# Patient Record
Sex: Female | Born: 1950 | Race: White | Hispanic: No | Marital: Married | Smoking: Never smoker
Health system: Southern US, Community
[De-identification: ages and names within clinical notes are randomized; demographics above are authoritative.]

## PROBLEM LIST (undated history)

## (undated) DIAGNOSIS — C50919 Malignant neoplasm of unspecified site of unspecified female breast: Secondary | ICD-10-CM

---

## 1980-10-21 DIAGNOSIS — C50919 Malignant neoplasm of unspecified site of unspecified female breast: Secondary | ICD-10-CM

## 1980-10-21 HISTORY — PX: MASTECTOMY: SHX3

## 1980-10-21 HISTORY — DX: Malignant neoplasm of unspecified site of unspecified female breast: C50.919

## 2004-10-10 ENCOUNTER — Ambulatory Visit: Payer: Self-pay | Admitting: Internal Medicine

## 2005-10-08 ENCOUNTER — Ambulatory Visit: Payer: Self-pay | Admitting: Internal Medicine

## 2006-01-09 ENCOUNTER — Ambulatory Visit: Payer: Self-pay | Admitting: Gastroenterology

## 2006-05-29 ENCOUNTER — Ambulatory Visit: Payer: Self-pay | Admitting: Gastroenterology

## 2006-11-10 ENCOUNTER — Ambulatory Visit: Payer: Self-pay | Admitting: Internal Medicine

## 2006-11-14 ENCOUNTER — Ambulatory Visit: Payer: Self-pay | Admitting: Internal Medicine

## 2006-11-21 HISTORY — PX: BREAST BIOPSY: SHX20

## 2006-12-09 ENCOUNTER — Ambulatory Visit: Payer: Self-pay | Admitting: Internal Medicine

## 2006-12-09 ENCOUNTER — Ambulatory Visit: Payer: Self-pay | Admitting: Surgery

## 2006-12-20 ENCOUNTER — Ambulatory Visit: Payer: Self-pay | Admitting: Internal Medicine

## 2007-02-09 ENCOUNTER — Ambulatory Visit: Payer: Self-pay | Admitting: Internal Medicine

## 2007-02-19 ENCOUNTER — Ambulatory Visit: Payer: Self-pay | Admitting: Internal Medicine

## 2007-03-25 ENCOUNTER — Ambulatory Visit: Payer: Self-pay | Admitting: Surgery

## 2008-02-18 ENCOUNTER — Ambulatory Visit: Payer: Self-pay | Admitting: Internal Medicine

## 2008-04-06 ENCOUNTER — Ambulatory Visit (HOSPITAL_COMMUNITY): Admission: RE | Admit: 2008-04-06 | Discharge: 2008-04-07 | Payer: Self-pay | Admitting: Neurosurgery

## 2008-05-05 ENCOUNTER — Encounter: Admission: RE | Admit: 2008-05-05 | Discharge: 2008-05-05 | Payer: Self-pay | Admitting: Neurosurgery

## 2008-10-11 ENCOUNTER — Ambulatory Visit: Payer: Self-pay | Admitting: Internal Medicine

## 2009-11-30 ENCOUNTER — Ambulatory Visit: Payer: Self-pay | Admitting: Internal Medicine

## 2010-02-28 ENCOUNTER — Ambulatory Visit: Payer: Self-pay | Admitting: Internal Medicine

## 2010-12-03 ENCOUNTER — Ambulatory Visit: Payer: Self-pay | Admitting: Internal Medicine

## 2011-03-05 NOTE — Op Note (Signed)
[name], [name]                    ACCOUNT NO.:  [id number]   MEDICAL RECORD NO.:  [id number]          PATIENT TYPE:  OIB   LOCATION:  3526                         FACILITY:  [facility]   PHYSICIAN:  Donalee Citrin, M.D.        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   PROCEDURE:  Anterior cervical diskectomy and fusion at C3-C4 and C4-C5  using 7-mm allograft wedges and a 42-mm venture plate with six 31-DV  variable screws.   SURGEON:  Donalee Citrin, MD   ASSISTANT:  Dr. Yetta Barre.   ANESTHESIA:  General endotracheal.   HISTORY OF PRESENT ILLNESS:  The patient is a very pleasant 60 year old  female who has had progressive worsening neck and bilateral shoulder  pain, refractory, pain radiating into deltoid down to her elbow.  Imaging showed severe cervical spondylosis with stenosis at C3-C4, C4-  C5.  At C4-C5, she had marked collapse with degenerative spondylosis and  spur compressing both C5 neuroforamen and central cord as well as motor  change in the endplates, C3-C4 as predominantly central stenosis from  soft disk material.  Due to the patient's very conservative treatment  and MRI findings and physical exam, the patient was recommended a 3-  level anterior cervical diskectomy fusion.  Risks and benefits of the  operation were explained to the patient and she understood and agreed to  proceed forth.   PROCEDURE IN DETAIL:  The patient was brought to the OR.  He was induced  general anesthesia, positioned supine, neck in slightly extension, and 5  pounds of Holter traction.  The right side of his neck was prepped and  draped in the usual sterile fashion.  Preop incision was localized at  the appropriate level.  A curvilinear incision was made just off the  midline to the anterior portion of the  sternocleidomastoid.  The  superficial layer of the platysma was dissected out and divided  longitudinally.  The avascular plane between the mastoid and strap  muscle was developed down to the prevertebral fascia.  The prevertebral  fascia was dissected with Kitners.  Intraoperative fluoroscopy confirmed  localization of the appropriate level at C5-C6 and annulotomy with a 15-  mm scalpel to mark the disk spaces and then the longus colli was  reflected laterally and self-retaining retractors were placed.  Large  anterior osteophytes were bitten up to C4-C5 disk space with calcified  disk anteriorly.  Then using a __________  C3-C4 vertebral bodies  respectively, the annulotomies were extended and high-speed drill was  used to drill down on both disk space on the posterior annulus and  posterior osteophyte complexes.  At this point, the operative microscope  was draped and brought into the field of operation first at C4-C5.  There was a marked collapse and degeneration at the disk space with  large osteophyte coming off the C4 vertebral body displacing  the central  canal and extending predominantly rightwards.  This was drilled down  under with decompressing the central canal then aggressive under biting  of both the C4-C5 endplates were carried out.  The PLL removed in  piecemeal fashion decompressing the thecal sac at this level marching  across both C5 pedicles and both C5 nerve roots were skeletonized,  flushed with a pedicle until a significant portion of the proximal C4  nerve roots were visualized on both sides and decompressed easily  accepting a nerve hook to confirm decompression.  Then endplates were  scraped.  Gelfoam was placed, tension taken off C3-C4 is in similar  fashion.  C3-C4 was drilled down.  Predominantly, C3-C4 was stenotic  from soft disk material.  This was all under the vertebral body  displacement of rightward aspect of the thecal sac.  This was  aggressively  under bitten decompression central canal both C4-C5  pedicles were identified.  PLL was removed in piecemeal fashion.  Both  C4 nerve roots were skeletonized, flushed with a pedicle, and after  adequate under biting of both endplates was achieved and central canal  was decompressed, the endplates were then scraped and a size 7-mm  allograft was __________  at C3-C4.  This was repeated at C4-C5.  Then,  a 42-mm venture plate was placed.  All screws had excellent purchase.  Locking mechanism was engaged.  Postop fluoroscopy confirmed good  position of the plates, screws, and bone grafts.  Then meticulous  hemostasis was maintained.  The wound was copiously irrigated and closed  with interrupted Vicryl closed with running 4-0 subcuticular.  Benzoin  and Steri-Strips were applied.  Sterile dressing were applied.  The  patient went to the recovery room in stable condition.  At the end of  the case, initial sponge, needle, and instrument counts were correct.           ______________________________  Donalee Citrin, M.D.     GC/MEDQ  D:  04/06/2008  T:  04/07/2008  Job:  [id number]

## 2011-07-02 ENCOUNTER — Ambulatory Visit: Payer: Self-pay | Admitting: Internal Medicine

## 2011-07-18 LAB — CBC
HCT: 36.5
MCV: 90.8
Platelets: 358
WBC: 7.6

## 2011-07-18 LAB — BASIC METABOLIC PANEL
BUN: 18
Chloride: 107
Glucose, Bld: 110 — ABNORMAL HIGH
Potassium: 4.4

## 2011-12-19 ENCOUNTER — Ambulatory Visit: Payer: Self-pay | Admitting: Internal Medicine

## 2012-03-09 ENCOUNTER — Ambulatory Visit: Payer: Self-pay | Admitting: Oncology

## 2012-03-09 ENCOUNTER — Ambulatory Visit: Payer: Self-pay

## 2013-04-08 ENCOUNTER — Ambulatory Visit: Payer: Self-pay | Admitting: Internal Medicine

## 2014-03-22 DIAGNOSIS — E039 Hypothyroidism, unspecified: Secondary | ICD-10-CM | POA: Insufficient documentation

## 2014-03-22 DIAGNOSIS — I10 Essential (primary) hypertension: Secondary | ICD-10-CM | POA: Insufficient documentation

## 2014-04-11 ENCOUNTER — Ambulatory Visit: Payer: Self-pay | Admitting: Internal Medicine

## 2015-03-24 ENCOUNTER — Other Ambulatory Visit: Payer: Self-pay | Admitting: Internal Medicine

## 2015-03-24 DIAGNOSIS — Z1231 Encounter for screening mammogram for malignant neoplasm of breast: Secondary | ICD-10-CM

## 2015-04-13 ENCOUNTER — Other Ambulatory Visit: Payer: Self-pay | Admitting: Internal Medicine

## 2015-04-13 ENCOUNTER — Ambulatory Visit
Admission: RE | Admit: 2015-04-13 | Discharge: 2015-04-13 | Disposition: A | Discharge status: Home / Self Care | Payer: No Typology Code available for payment source | Source: Ambulatory Visit | Attending: Internal Medicine | Admitting: Internal Medicine

## 2015-04-13 DIAGNOSIS — Z1231 Encounter for screening mammogram for malignant neoplasm of breast: Secondary | ICD-10-CM | POA: Insufficient documentation

## 2015-04-13 HISTORY — DX: Malignant neoplasm of unspecified site of unspecified female breast: C50.919

## 2015-06-19 ENCOUNTER — Other Ambulatory Visit: Payer: Self-pay | Admitting: Nurse Practitioner

## 2015-06-19 DIAGNOSIS — R1310 Dysphagia, unspecified: Secondary | ICD-10-CM

## 2015-06-19 DIAGNOSIS — K219 Gastro-esophageal reflux disease without esophagitis: Secondary | ICD-10-CM | POA: Insufficient documentation

## 2015-06-22 ENCOUNTER — Ambulatory Visit
Admission: RE | Admit: 2015-06-22 | Discharge: 2015-06-22 | Disposition: A | Discharge status: Home / Self Care | Payer: PRIVATE HEALTH INSURANCE | Source: Ambulatory Visit | Attending: Nurse Practitioner | Admitting: Nurse Practitioner

## 2015-06-22 DIAGNOSIS — K228 Other specified diseases of esophagus: Secondary | ICD-10-CM | POA: Insufficient documentation

## 2015-06-22 DIAGNOSIS — R1310 Dysphagia, unspecified: Secondary | ICD-10-CM | POA: Insufficient documentation

## 2016-03-25 ENCOUNTER — Other Ambulatory Visit: Payer: Self-pay | Admitting: Internal Medicine

## 2016-03-25 DIAGNOSIS — Z1231 Encounter for screening mammogram for malignant neoplasm of breast: Secondary | ICD-10-CM

## 2016-04-16 ENCOUNTER — Ambulatory Visit
Admission: RE | Admit: 2016-04-16 | Discharge: 2016-04-16 | Disposition: A | Discharge status: Home / Self Care | Payer: Managed Care, Other (non HMO) | Source: Ambulatory Visit | Attending: Internal Medicine | Admitting: Internal Medicine

## 2016-04-16 ENCOUNTER — Other Ambulatory Visit: Payer: Self-pay | Admitting: Internal Medicine

## 2016-04-16 DIAGNOSIS — Z1231 Encounter for screening mammogram for malignant neoplasm of breast: Secondary | ICD-10-CM

## 2016-10-18 ENCOUNTER — Ambulatory Visit: Payer: Self-pay | Admitting: Physician Assistant

## 2016-10-18 ENCOUNTER — Encounter: Payer: Self-pay | Admitting: Physician Assistant

## 2016-10-18 VITALS — BP 100/62 | HR 83 | Temp 98.6°F

## 2016-10-18 DIAGNOSIS — R0981 Nasal congestion: Secondary | ICD-10-CM

## 2016-10-18 NOTE — Progress Notes (Signed)
   Subjective:Sinus congestion    Patient ID: [name], female    DOB: December 01, 1950, 65 y.o.   MRN: [id number]  HPI Patient complian of sinus congestion onset yesterday. 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 States cannot tolerate Sudafed products 2nd to upset stomach. Denies fever. States facial and ear pressure. Denies N/V/D.   Review of Systems Hyperthyroidism    Objective:   Physical Exam No acute distress. HEENT for bilateral maxillary guarding. Edematous right TM. Edematous nasal turbinates. Clear Rhinorrhea. Neck supple. Lungs CTA and Heart RRR.       Assessment & Plan:Sinus  congestion.   Continue Allerga and start Phenylephrine as directed.  Follow up with Family Doctor if no improvement.

## 2017-03-27 ENCOUNTER — Other Ambulatory Visit: Payer: Self-pay | Admitting: Internal Medicine

## 2017-03-27 DIAGNOSIS — Z1231 Encounter for screening mammogram for malignant neoplasm of breast: Secondary | ICD-10-CM

## 2017-04-17 ENCOUNTER — Ambulatory Visit
Admission: RE | Admit: 2017-04-17 | Discharge: 2017-04-17 | Disposition: A | Discharge status: Home / Self Care | Payer: Managed Care, Other (non HMO) | Source: Ambulatory Visit | Attending: Internal Medicine | Admitting: Internal Medicine

## 2017-04-17 DIAGNOSIS — Z1231 Encounter for screening mammogram for malignant neoplasm of breast: Secondary | ICD-10-CM | POA: Insufficient documentation

## 2017-08-14 ENCOUNTER — Ambulatory Visit: Payer: Self-pay | Admitting: Physician Assistant

## 2017-08-14 VITALS — BP 140/70 | HR 93 | Temp 98.7°F | Resp 16

## 2017-08-14 DIAGNOSIS — J069 Acute upper respiratory infection, unspecified: Secondary | ICD-10-CM

## 2017-08-14 MED ORDER — METHYLPREDNISOLONE 4 MG PO TBPK: ORAL_TABLET | ORAL | 0 refills | Status: AC

## 2017-08-14 MED ORDER — AZITHROMYCIN 250 MG PO TABS: ORAL_TABLET | ORAL | 0 refills | Status: AC

## 2017-08-14 NOTE — Progress Notes (Signed)
S: C/o cough and congestion for 9 days, no fever, chills, cp/sob, v/d; mucus was green this am but clear throughout the day, cough is sporadic, will start coughing and can't stop   Using otc meds: robitussin  O: PE: vitals wnl, nad, perrl eomi, normocephalic, tms dull, nasal mucosa red and swollen, throat injected, neck supple no lymph, lungs c t a, cv rrr, neuro intact  A:  Acute uri   P: drink fluids, continue regular meds , use otc meds of choice, return if not improving in 5 days, return earlier if worsening , zpack, medrol dose pack, tussionex 137ml

## 2018-03-09 ENCOUNTER — Other Ambulatory Visit: Payer: Self-pay | Admitting: Internal Medicine

## 2018-03-09 DIAGNOSIS — Z1231 Encounter for screening mammogram for malignant neoplasm of breast: Secondary | ICD-10-CM

## 2018-04-21 ENCOUNTER — Ambulatory Visit
Admission: RE | Admit: 2018-04-21 | Discharge: 2018-04-21 | Disposition: A | Discharge status: Home / Self Care | Payer: Medicare HMO | Source: Ambulatory Visit | Attending: Internal Medicine | Admitting: Internal Medicine

## 2018-04-21 DIAGNOSIS — Z1231 Encounter for screening mammogram for malignant neoplasm of breast: Secondary | ICD-10-CM | POA: Diagnosis not present

## 2018-04-28 ENCOUNTER — Other Ambulatory Visit: Payer: Self-pay | Admitting: Internal Medicine

## 2018-04-28 DIAGNOSIS — R928 Other abnormal and inconclusive findings on diagnostic imaging of breast: Secondary | ICD-10-CM

## 2018-04-28 DIAGNOSIS — N6310 Unspecified lump in the right breast, unspecified quadrant: Secondary | ICD-10-CM

## 2018-04-29 ENCOUNTER — Ambulatory Visit
Admission: RE | Admit: 2018-04-29 | Discharge: 2018-04-29 | Disposition: A | Discharge status: Home / Self Care | Payer: Medicare HMO | Source: Ambulatory Visit | Attending: Internal Medicine | Admitting: Internal Medicine

## 2018-04-29 ENCOUNTER — Other Ambulatory Visit: Payer: Self-pay | Admitting: Internal Medicine

## 2018-04-29 DIAGNOSIS — N6310 Unspecified lump in the right breast, unspecified quadrant: Secondary | ICD-10-CM

## 2018-04-29 DIAGNOSIS — R928 Other abnormal and inconclusive findings on diagnostic imaging of breast: Secondary | ICD-10-CM

## 2018-04-30 ENCOUNTER — Other Ambulatory Visit: Payer: Self-pay | Admitting: Internal Medicine

## 2018-05-01 ENCOUNTER — Other Ambulatory Visit: Payer: Self-pay | Admitting: Internal Medicine

## 2018-05-01 DIAGNOSIS — N6001 Solitary cyst of right breast: Secondary | ICD-10-CM

## 2018-05-01 DIAGNOSIS — R928 Other abnormal and inconclusive findings on diagnostic imaging of breast: Secondary | ICD-10-CM

## 2018-05-04 ENCOUNTER — Other Ambulatory Visit: Payer: Self-pay | Admitting: Internal Medicine

## 2018-05-04 ENCOUNTER — Ambulatory Visit
Admission: RE | Admit: 2018-05-04 | Discharge: 2018-05-04 | Disposition: A | Discharge status: Home / Self Care | Payer: Medicare HMO | Source: Ambulatory Visit | Attending: Internal Medicine | Admitting: Internal Medicine

## 2018-05-04 DIAGNOSIS — N6001 Solitary cyst of right breast: Secondary | ICD-10-CM | POA: Diagnosis present

## 2018-05-04 DIAGNOSIS — R229 Localized swelling, mass and lump, unspecified: Principal | ICD-10-CM

## 2018-05-04 DIAGNOSIS — IMO0002 Reserved for concepts with insufficient information to code with codable children: Secondary | ICD-10-CM

## 2018-05-04 DIAGNOSIS — R928 Other abnormal and inconclusive findings on diagnostic imaging of breast: Secondary | ICD-10-CM | POA: Insufficient documentation

## 2018-05-04 HISTORY — PX: BREAST BIOPSY: SHX20

## 2018-05-05 LAB — SURGICAL PATHOLOGY

## 2019-04-26 ENCOUNTER — Other Ambulatory Visit: Payer: Self-pay | Admitting: Internal Medicine

## 2019-04-26 DIAGNOSIS — Z1231 Encounter for screening mammogram for malignant neoplasm of breast: Secondary | ICD-10-CM

## 2019-06-02 ENCOUNTER — Ambulatory Visit
Admission: RE | Admit: 2019-06-02 | Discharge: 2019-06-02 | Disposition: A | Discharge status: Home / Self Care | Payer: Medicare HMO | Source: Ambulatory Visit | Attending: Internal Medicine | Admitting: Internal Medicine

## 2019-06-02 ENCOUNTER — Other Ambulatory Visit: Payer: Self-pay

## 2019-06-02 DIAGNOSIS — Z1231 Encounter for screening mammogram for malignant neoplasm of breast: Secondary | ICD-10-CM | POA: Diagnosis not present

## 2019-06-08 IMAGING — MG MM BREAST LOCALIZATION CLIP
2 series · 2 of 2 positions shown · non-contrast
Comparison: Previous exam(s).

CLINICAL DATA: Status post ultrasound-guided biopsy of an
indeterminate RIGHT breast mass at the 4 o'clock axis

EXAM:
DIAGNOSTIC RIGHT MAMMOGRAM POST ULTRASOUND BIOPSY

[R CC]
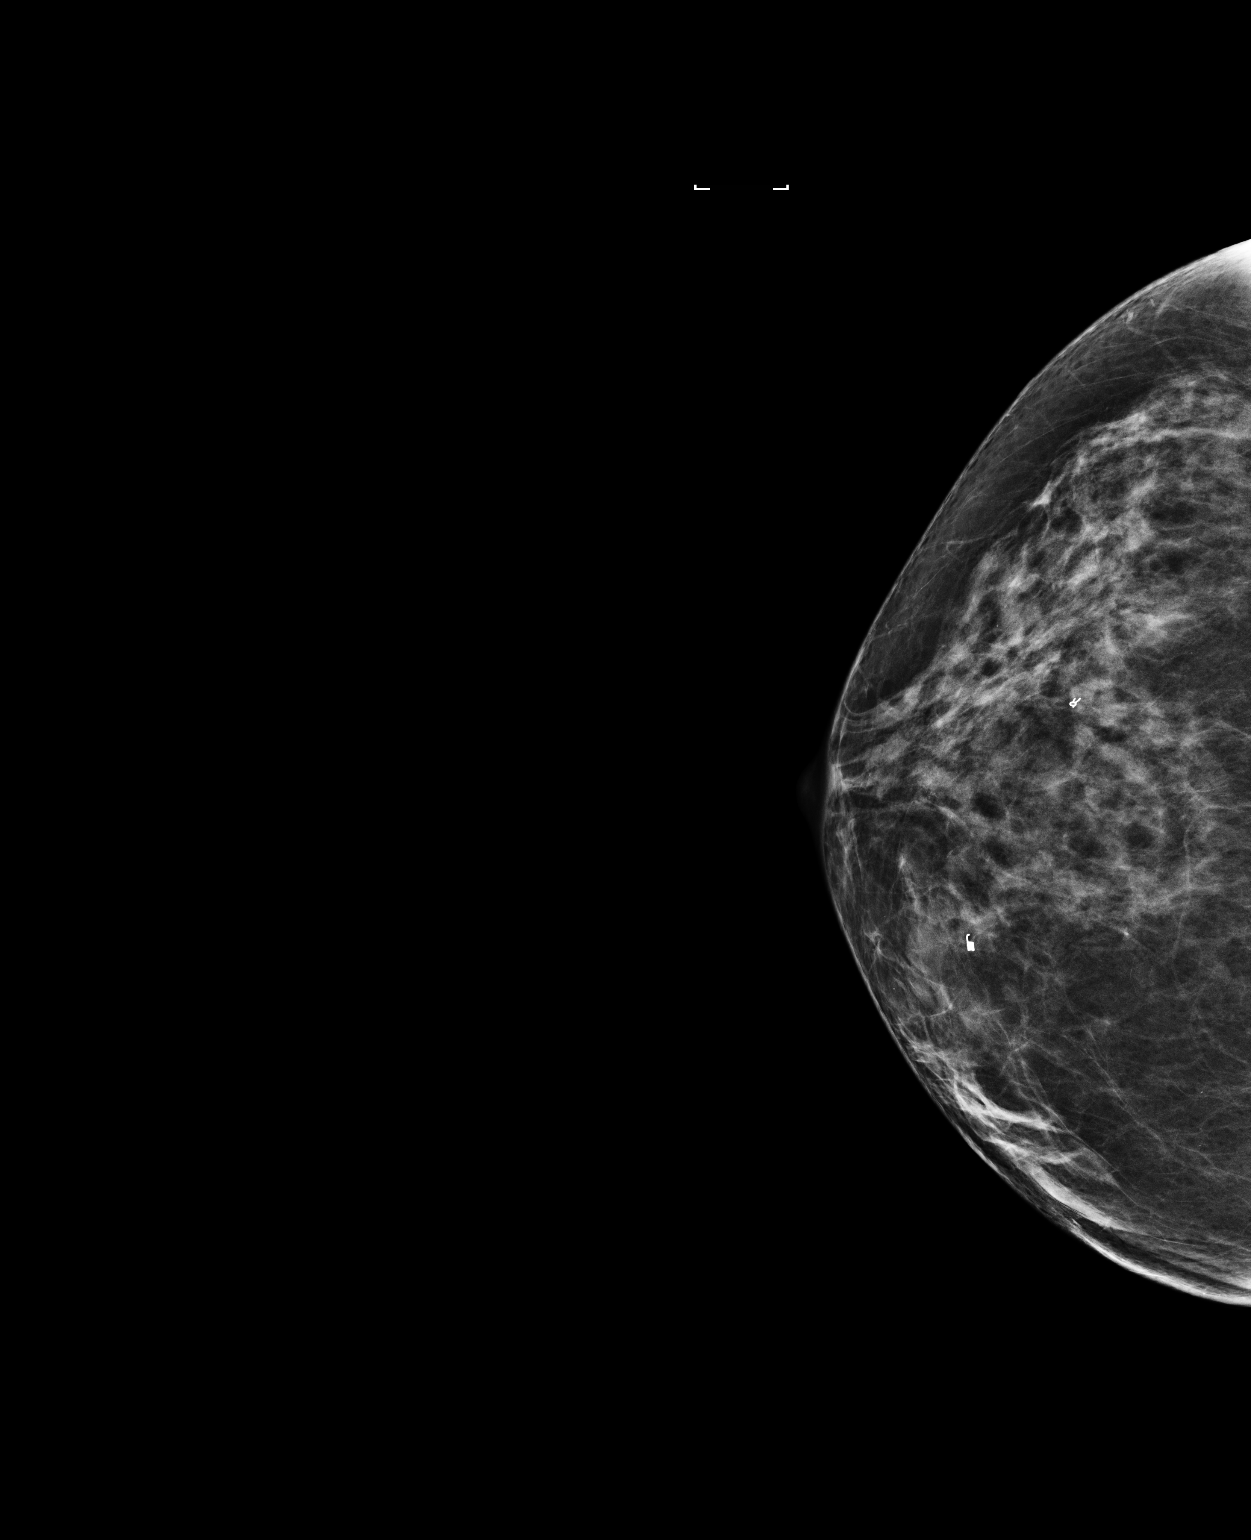

[R ML]
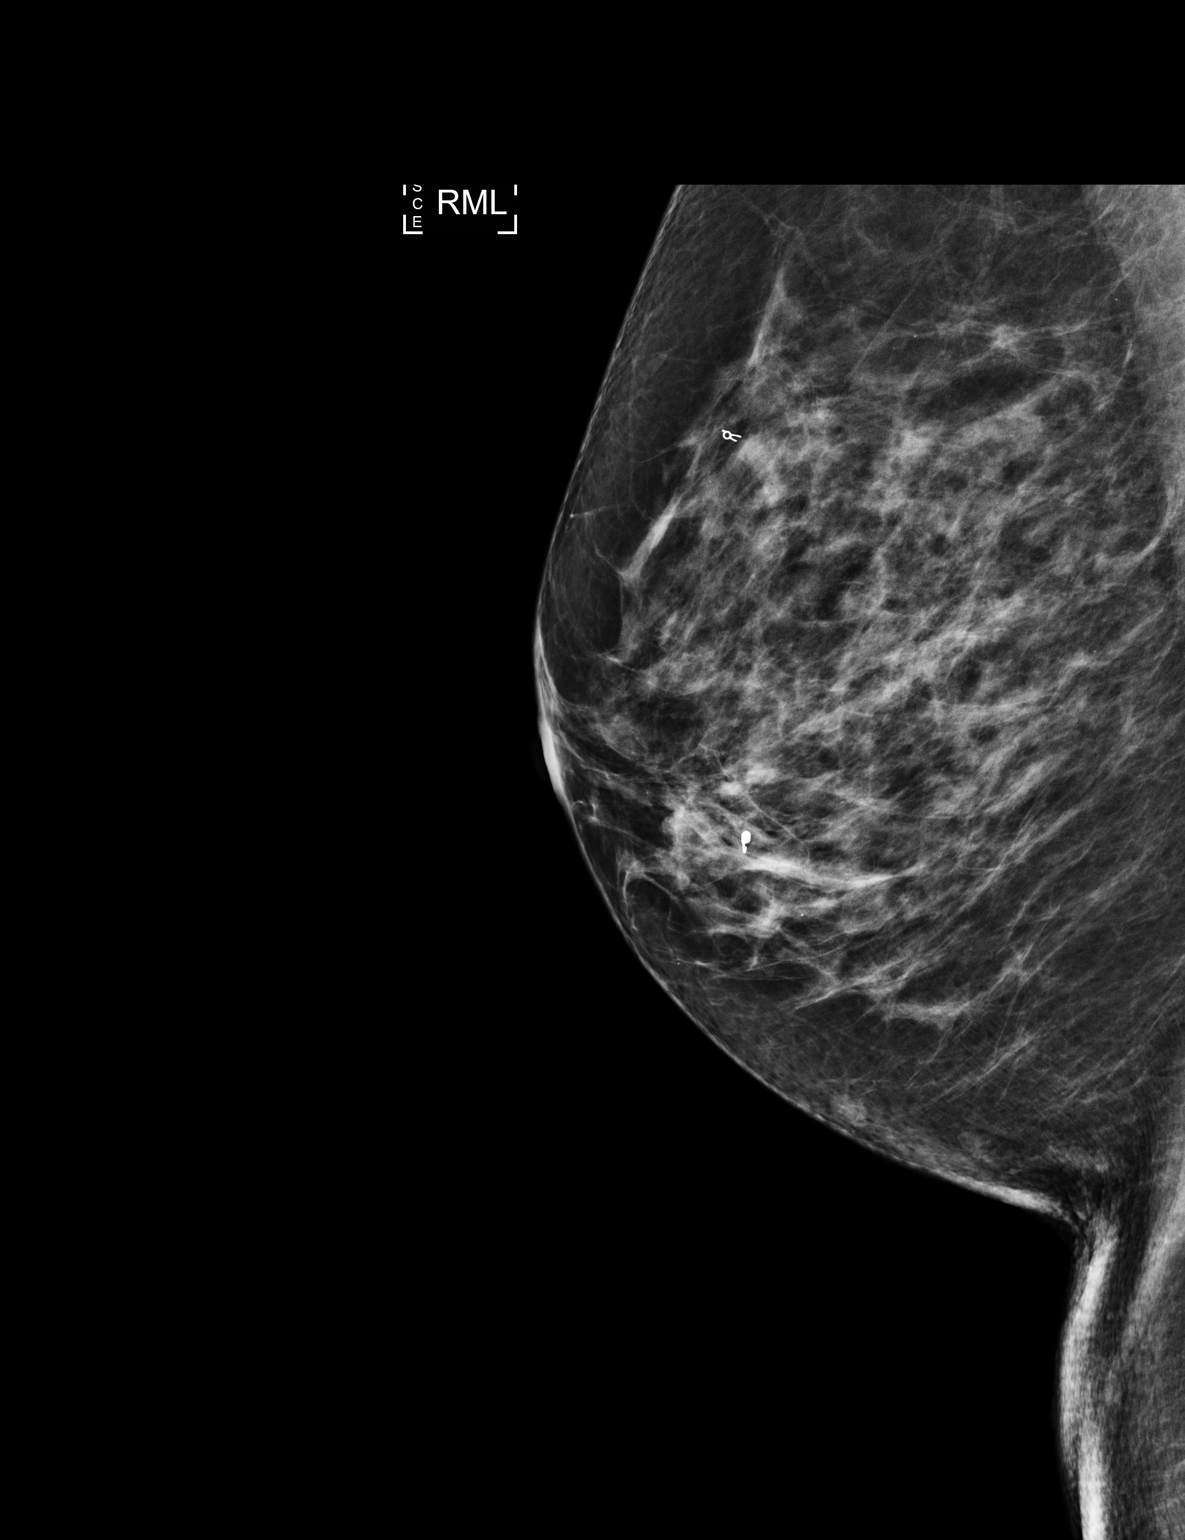

[2 of 2 positions shown; findings below may reference images not displayed]

FINDINGS: Mammographic images were obtained following ultrasound guided biopsy
of the RIGHT breast mass at the 4 o'clock axis. Coil shaped clip is
adequately positioned at the posterior margin of the targeted mass.
IMPRESSION: Coil shaped biopsy clip is adequately positioned at the posterior
margin of the targeted mass in the RIGHT breast at the 4 o'clock
axis.

Final Assessment: Post Procedure Mammograms for Marker Placement

## 2020-05-10 ENCOUNTER — Other Ambulatory Visit: Payer: Self-pay | Admitting: Internal Medicine

## 2020-05-10 DIAGNOSIS — Z1231 Encounter for screening mammogram for malignant neoplasm of breast: Secondary | ICD-10-CM

## 2020-05-31 LAB — EXTERNAL GENERIC LAB PROCEDURE: COLOGUARD: POSITIVE — AB

## 2020-06-08 ENCOUNTER — Other Ambulatory Visit: Payer: Self-pay

## 2020-06-08 ENCOUNTER — Ambulatory Visit
Admission: RE | Admit: 2020-06-08 | Discharge: 2020-06-08 | Disposition: A | Discharge status: Home / Self Care | Payer: Medicare HMO | Source: Ambulatory Visit | Attending: Internal Medicine | Admitting: Internal Medicine

## 2020-06-08 DIAGNOSIS — Z1231 Encounter for screening mammogram for malignant neoplasm of breast: Secondary | ICD-10-CM | POA: Diagnosis present

## 2020-07-06 IMAGING — MG DIGITAL SCREENING UNILATERAL RIGHT MAMMOGRAM WITH CAD AND TOMO
4 series · 4 of 12 positions shown · non-contrast
Comparison: Previous exam(s).

CLINICAL DATA: Screening. History of LEFT breast cancer in 1845
status post mastectomy.

EXAM:
DIGITAL SCREENING UNILATERAL RIGHT MAMMOGRAM WITH CAD AND TOMO

[R CC synth-2D]
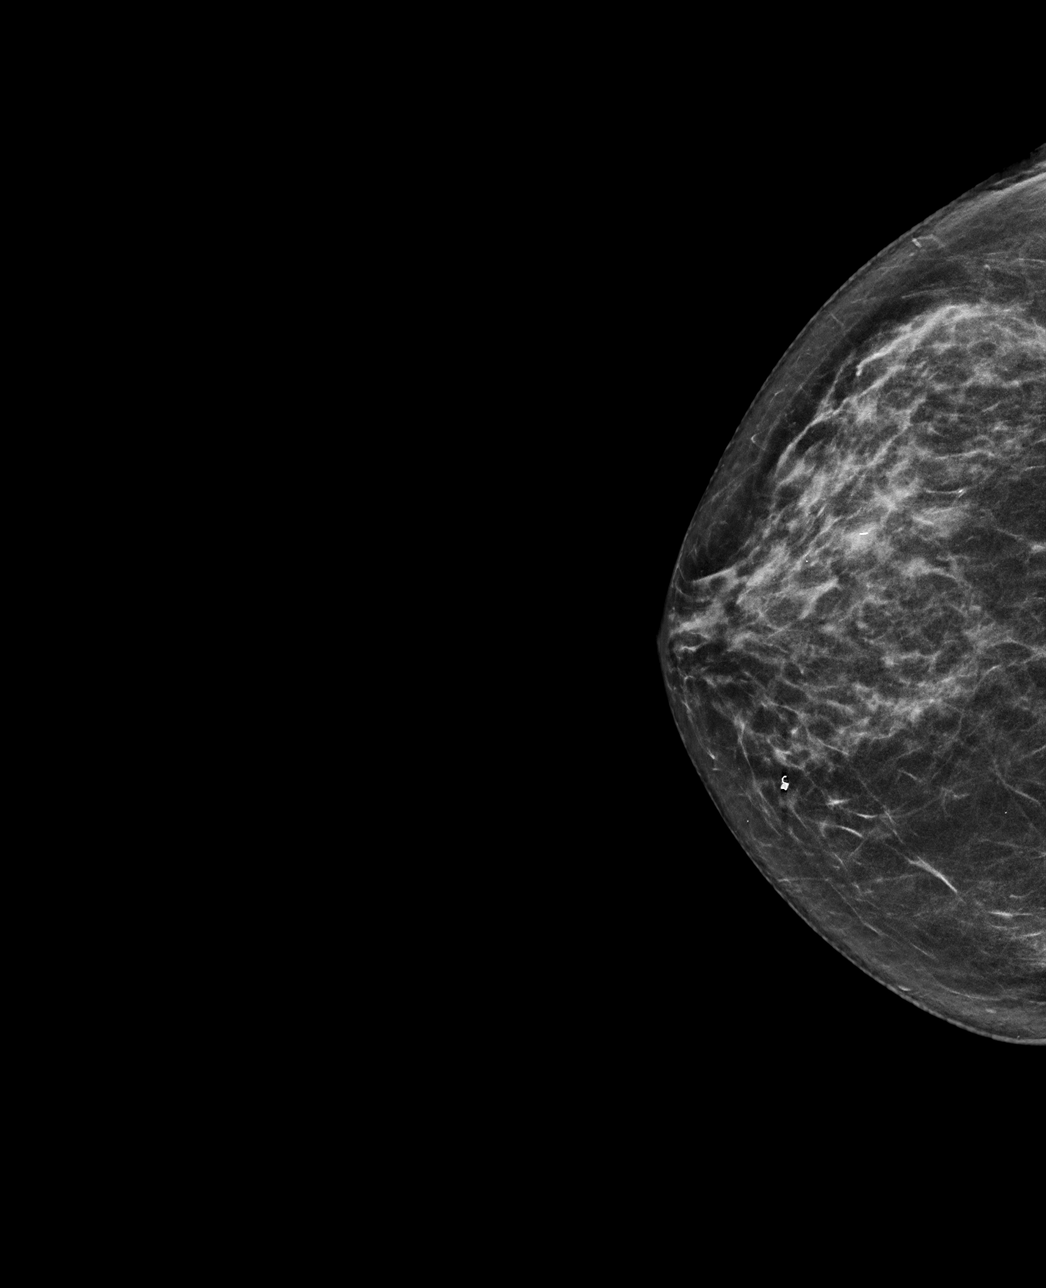

[R MLO synth-2D]
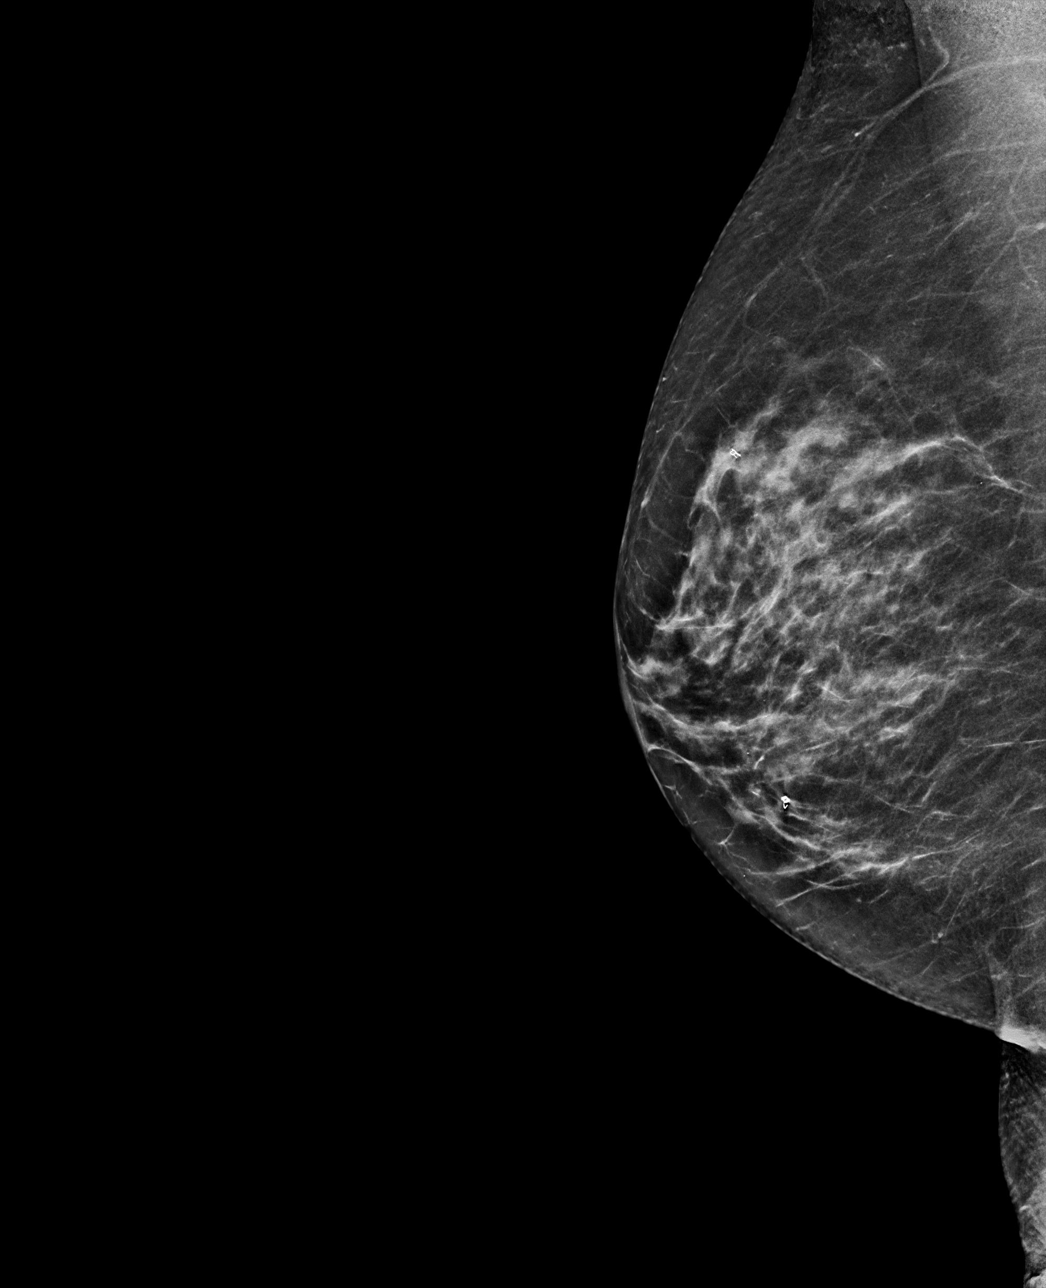

[R CC tomo · tomo slice 29/58.0]
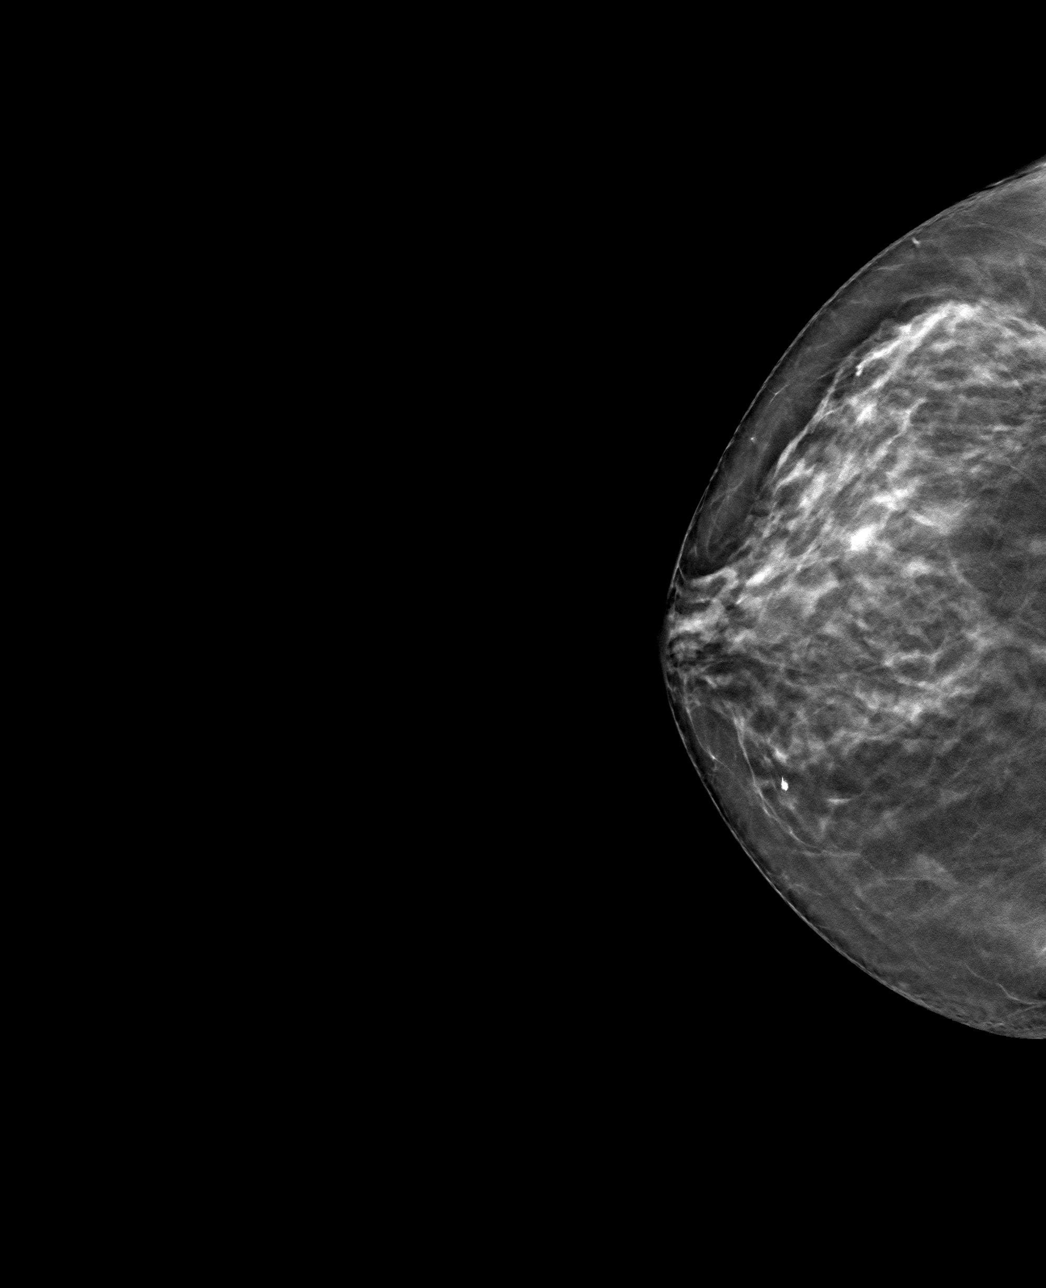

[R MLO tomo · tomo slice 31/60.0]
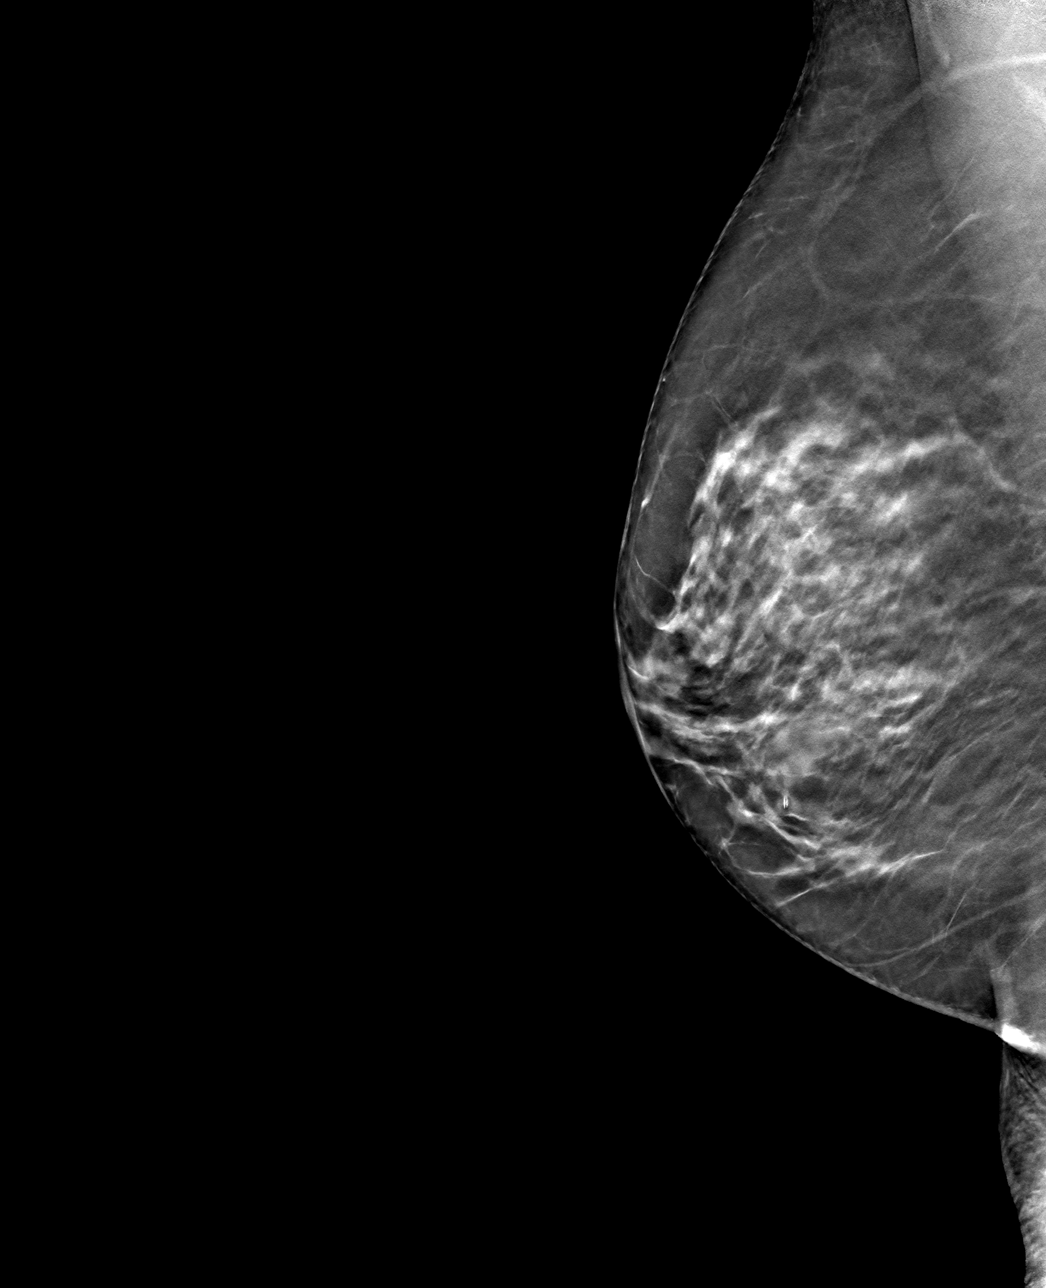

[4 of 12 positions shown; findings below may reference images not displayed]

ACR Breast Density Category c: The breast tissue is heterogeneously
dense, which may obscure small masses.
FINDINGS: There are no findings suspicious for malignancy. Images were
processed with CAD.
IMPRESSION: No mammographic evidence of malignancy. A result letter of this
screening mammogram will be mailed directly to the patient.

RECOMMENDATION:
Screening mammogram in one year. (Code:9X-[id number])

BI-RADS CATEGORY  1: Negative.

## 2021-05-23 ENCOUNTER — Other Ambulatory Visit: Payer: Self-pay | Admitting: Internal Medicine

## 2021-05-23 DIAGNOSIS — Z1231 Encounter for screening mammogram for malignant neoplasm of breast: Secondary | ICD-10-CM

## 2021-06-15 ENCOUNTER — Ambulatory Visit
Admission: RE | Admit: 2021-06-15 | Discharge: 2021-06-15 | Disposition: A | Discharge status: Home / Self Care | Payer: Medicare HMO | Source: Ambulatory Visit | Attending: Internal Medicine | Admitting: Internal Medicine

## 2021-06-15 ENCOUNTER — Other Ambulatory Visit: Payer: Self-pay

## 2021-06-15 DIAGNOSIS — Z1231 Encounter for screening mammogram for malignant neoplasm of breast: Secondary | ICD-10-CM | POA: Diagnosis present

## 2022-05-08 ENCOUNTER — Other Ambulatory Visit: Payer: Self-pay | Admitting: Internal Medicine

## 2022-05-08 DIAGNOSIS — Z1231 Encounter for screening mammogram for malignant neoplasm of breast: Secondary | ICD-10-CM

## 2022-06-19 ENCOUNTER — Ambulatory Visit
Admission: RE | Admit: 2022-06-19 | Discharge: 2022-06-19 | Disposition: A | Discharge status: Home / Self Care | Payer: Medicare HMO | Source: Ambulatory Visit | Attending: Internal Medicine | Admitting: Internal Medicine

## 2022-06-19 DIAGNOSIS — Z1231 Encounter for screening mammogram for malignant neoplasm of breast: Secondary | ICD-10-CM | POA: Diagnosis present

## 2023-05-06 ENCOUNTER — Other Ambulatory Visit: Payer: Self-pay | Admitting: Internal Medicine

## 2023-05-06 DIAGNOSIS — Z1231 Encounter for screening mammogram for malignant neoplasm of breast: Secondary | ICD-10-CM

## 2023-06-24 ENCOUNTER — Ambulatory Visit
Admission: RE | Admit: 2023-06-24 | Discharge: 2023-06-24 | Disposition: A | Discharge status: Home / Self Care | Payer: Medicare HMO | Source: Ambulatory Visit | Attending: Internal Medicine | Admitting: Internal Medicine

## 2023-06-24 DIAGNOSIS — Z1231 Encounter for screening mammogram for malignant neoplasm of breast: Secondary | ICD-10-CM | POA: Insufficient documentation

## 2024-03-19 ENCOUNTER — Ambulatory Visit: Payer: Self-pay

## 2024-03-19 DIAGNOSIS — Z09 Encounter for follow-up examination after completed treatment for conditions other than malignant neoplasm: Secondary | ICD-10-CM | POA: Diagnosis not present

## 2024-03-19 DIAGNOSIS — R1314 Dysphagia, pharyngoesophageal phase: Secondary | ICD-10-CM

## 2024-03-19 DIAGNOSIS — K219 Gastro-esophageal reflux disease without esophagitis: Secondary | ICD-10-CM

## 2024-03-19 DIAGNOSIS — K640 First degree hemorrhoids: Secondary | ICD-10-CM | POA: Diagnosis not present

## 2024-03-19 DIAGNOSIS — Z860101 Personal history of adenomatous and serrated colon polyps: Secondary | ICD-10-CM

## 2024-03-19 DIAGNOSIS — R110 Nausea: Secondary | ICD-10-CM

## 2024-03-19 DIAGNOSIS — K5730 Diverticulosis of large intestine without perforation or abscess without bleeding: Secondary | ICD-10-CM

## 2024-03-19 DIAGNOSIS — K222 Esophageal obstruction: Secondary | ICD-10-CM | POA: Diagnosis not present

## 2024-05-12 ENCOUNTER — Other Ambulatory Visit: Payer: Self-pay | Admitting: Internal Medicine

## 2024-05-12 DIAGNOSIS — Z1231 Encounter for screening mammogram for malignant neoplasm of breast: Secondary | ICD-10-CM

## 2024-06-24 ENCOUNTER — Ambulatory Visit
Admission: RE | Admit: 2024-06-24 | Discharge: 2024-06-24 | Disposition: A | Discharge status: Home / Self Care | Source: Ambulatory Visit | Attending: Internal Medicine | Admitting: Internal Medicine

## 2024-06-24 ENCOUNTER — Other Ambulatory Visit: Payer: Self-pay | Admitting: Internal Medicine

## 2024-06-24 DIAGNOSIS — Z1231 Encounter for screening mammogram for malignant neoplasm of breast: Secondary | ICD-10-CM
